# Patient Record
Sex: Male | Born: 1995 | Race: White | Hispanic: No | Marital: Single | Smoking: Never smoker
Health system: Southern US, Community
[De-identification: ages and names within clinical notes are randomized; demographics above are authoritative.]

## PROBLEM LIST (undated history)

## (undated) DIAGNOSIS — F1020 Alcohol dependence, uncomplicated: Secondary | ICD-10-CM

## (undated) DIAGNOSIS — J40 Bronchitis, not specified as acute or chronic: Secondary | ICD-10-CM

## (undated) DIAGNOSIS — M222X9 Patellofemoral disorders, unspecified knee: Secondary | ICD-10-CM

## (undated) DIAGNOSIS — K3580 Unspecified acute appendicitis: Secondary | ICD-10-CM

## (undated) DIAGNOSIS — J019 Acute sinusitis, unspecified: Secondary | ICD-10-CM

## (undated) DIAGNOSIS — F329 Major depressive disorder, single episode, unspecified: Secondary | ICD-10-CM

## (undated) HISTORY — DX: Major depressive disorder, single episode, unspecified: F32.9

## (undated) HISTORY — DX: Alcohol dependence, uncomplicated: F10.20

## (undated) HISTORY — DX: Bronchitis, not specified as acute or chronic: J40

## (undated) HISTORY — DX: Unspecified acute appendicitis: K35.80

## (undated) HISTORY — DX: Acute sinusitis, unspecified: J01.90

## (undated) HISTORY — DX: Patellofemoral disorders, unspecified knee: M22.2X9

## (undated) HISTORY — PX: ADENOIDECTOMY: SUR15

---
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
## 2015-06-04 DIAGNOSIS — J019 Acute sinusitis, unspecified: Secondary | ICD-10-CM | POA: Insufficient documentation

## 2015-06-04 HISTORY — DX: Acute sinusitis, unspecified: J01.90

## 2016-02-14 DIAGNOSIS — J358 Other chronic diseases of tonsils and adenoids: Secondary | ICD-10-CM | POA: Insufficient documentation

## 2016-12-16 DIAGNOSIS — K649 Unspecified hemorrhoids: Secondary | ICD-10-CM | POA: Insufficient documentation

## 2016-12-16 DIAGNOSIS — K625 Hemorrhage of anus and rectum: Secondary | ICD-10-CM | POA: Diagnosis present

## 2016-12-16 DIAGNOSIS — F17200 Nicotine dependence, unspecified, uncomplicated: Secondary | ICD-10-CM | POA: Diagnosis not present

## 2016-12-16 LAB — COMPREHENSIVE METABOLIC PANEL
ALBUMIN: 4.6 g/dL (ref 3.5–5.0)
ALT: 55 U/L (ref 17–63)
ANION GAP: 8 (ref 5–15)
AST: 127 U/L — ABNORMAL HIGH (ref 15–41)
Alkaline Phosphatase: 73 U/L (ref 38–126)
BILIRUBIN TOTAL: 0.8 mg/dL (ref 0.3–1.2)
BUN: 14 mg/dL (ref 6–20)
CO2: 28 mmol/L (ref 22–32)
Calcium: 9.2 mg/dL (ref 8.9–10.3)
Chloride: 100 mmol/L — ABNORMAL LOW (ref 101–111)
Creatinine, Ser: 1.2 mg/dL (ref 0.61–1.24)
Glucose, Bld: 108 mg/dL — ABNORMAL HIGH (ref 65–99)
POTASSIUM: 3.2 mmol/L — AB (ref 3.5–5.1)
Sodium: 136 mmol/L (ref 135–145)
TOTAL PROTEIN: 7.8 g/dL (ref 6.5–8.1)

## 2016-12-16 LAB — CBC
HCT: 41.2 % (ref 40.0–52.0)
HEMOGLOBIN: 14.3 g/dL (ref 13.0–18.0)
MCH: 29.8 pg (ref 26.0–34.0)
MCHC: 34.8 g/dL (ref 32.0–36.0)
MCV: 85.5 fL (ref 80.0–100.0)
Platelets: 276 10*3/uL (ref 150–440)
RBC: 4.82 MIL/uL (ref 4.40–5.90)
RDW: 12.2 % (ref 11.5–14.5)
WBC: 10.4 10*3/uL (ref 3.8–10.6)

## 2016-12-16 NOTE — ED Triage Notes (Signed)
Patient reports he has noticed blood in his stool "for while".  Reports heavier and bright red over the past few days.

## 2016-12-17 ENCOUNTER — Emergency Department
Admission: EM | Admit: 2016-12-17 | Discharge: 2016-12-17 | Disposition: A | Discharge status: Home / Self Care | Payer: BLUE CROSS/BLUE SHIELD | Attending: Emergency Medicine | Admitting: Emergency Medicine

## 2016-12-17 ENCOUNTER — Encounter: Payer: Self-pay | Admitting: Emergency Medicine

## 2016-12-17 DIAGNOSIS — K625 Hemorrhage of anus and rectum: Secondary | ICD-10-CM

## 2016-12-17 DIAGNOSIS — K649 Unspecified hemorrhoids: Secondary | ICD-10-CM

## 2016-12-17 MED ORDER — POTASSIUM CHLORIDE CRYS ER 20 MEQ PO TBCR
40.0000 meq | EXTENDED_RELEASE_TABLET | Freq: Once | ORAL | Status: AC
  Administered 2016-12-17: 40 meq via ORAL
  Filled 2016-12-17: qty 2

## 2016-12-17 NOTE — ED Notes (Signed)
Pt. Going home with friend 

## 2016-12-17 NOTE — Discharge Instructions (Signed)
Please follow-up with student health or with GI.

## 2016-12-17 NOTE — ED Provider Notes (Signed)
[facility] Emergency Department Provider Note   ____________________________________________   First MD Initiated Contact with Patient 12/17/16 [phone]     (approximate)  I have reviewed the triage vital signs and the nursing notes.   HISTORY  Chief Complaint Rectal Bleeding    HPI Carl Joseph is a 21 y.o. male who comes into the hospital today with some blood in his stool. The patient reports that yesterday and today he went to the bathroom and saw blood in his stool. He reports that he wasn't straining he just kind of sat and it came out. He reports that he called his mom and asked if he should come into the hospital and his mom said that he should. He reports that he was a little bit gassy in the stool came out tonight coated in blood. He reports that there was some blood in the water. 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 He reports that he has also had blood on the tissue many times in the past that he's never actually had checked out. The patient denies any abdominal pain and denies any nausea or vomiting. He does not have a known history of hemorrhoids but again has never had it checked. The patient reports that last weekend he was drinking a lot with his friend brothers and was concerned that this may have resulted from his weekend of drinking. He is here today for evaluation.The patient has not had any lightheadedness or dizziness, he denies any shortness of breath, chest pain or other concerns.   History reviewed. No pertinent past medical history.  There are no active problems to display for this patient.   Past Surgical History:  Procedure Laterality Date  . ADENOIDECTOMY      Prior to Admission medications   Not on File    Allergies Patient has no known allergies.  No family history on file.  Social History Social History  Substance Use Topics  . Smoking  status: Current Some Day Smoker  . Smokeless tobacco: Not on file  . Alcohol use Yes    Review of Systems  Constitutional: No fever/chills Eyes: No visual changes. ENT: No sore throat. Cardiovascular: Denies chest pain. Respiratory: Denies shortness of breath. Gastrointestinal: No abdominal pain.  No nausea, no vomiting.  No diarrhea.  No constipation. Rectal bleeding Genitourinary: Negative for dysuria. Musculoskeletal: Negative for back pain. Skin: Negative for rash. Neurological: Negative for headaches, focal weakness or numbness.   ____________________________________________   PHYSICAL EXAM:  VITAL SIGNS: ED Triage Vitals  Enc Vitals Group     BP 12/16/16 2237 (!) 154/95     Pulse Rate 12/16/16 2237 96     Resp 12/16/16 2237 (!) 22     Temp 12/16/16 2237 98 F (36.7 C)     Temp Source 12/16/16 2237 Oral     SpO2 12/16/16 2237 99 %     Weight 12/16/16 2236 190 lb (86.2 kg)     Height 12/16/16 2236  (1.803 m)     Head Circumference --      Peak Flow --      Pain Score --      Pain Loc --      Pain Edu? --      Excl. in GC? --     Constitutional: Alert and oriented. Well appearing and in no acute distress. Eyes: Conjunctivae are normal. PERRL. EOMI. Head:  Atraumatic. Nose: No congestion/rhinnorhea. Mouth/Throat: Mucous membranes are moist.  Oropharynx non-erythematous. Cardiovascular: Normal rate, regular rhythm. Grossly normal heart sounds.  Good peripheral circulation. Respiratory: Normal respiratory effort.  No retractions. Lungs CTAB. Gastrointestinal: Soft and nontender. No distention. Positive bowel sounds Rectal: No visible hemorrhoids on rectal exam. Patient has brown stool that is heme positive on Hemoccult no tenderness to palpation of rectum. Musculoskeletal: No lower extremity tenderness nor edema.  . Neurologic:  Normal speech and language.  Skin:  Skin is warm, dry and intact.  Psychiatric: Mood and affect are normal.    ____________________________________________   LABS (all labs ordered are listed, but only abnormal results are displayed)  Labs Reviewed  COMPREHENSIVE METABOLIC PANEL - Abnormal; Notable for the following:       Result Value   Potassium 3.2 (*)    Chloride 100 (*)    Glucose, Bld 108 (*)    AST 127 (*)    All other components within normal limits  CBC  POC OCCULT BLOOD, ED   ____________________________________________  EKG  none ____________________________________________  RADIOLOGY  none ____________________________________________   PROCEDURES  Procedure(s) performed: None  Procedures  Critical Care performed: No  ____________________________________________   INITIAL IMPRESSION / ASSESSMENT AND PLAN / ED COURSE  Pertinent labs & imaging results that were available during my care of the patient were reviewed by me and considered in my medical decision making (see chart for details).  This is a 22 year old male who comes into the hospital today with 2 episodes of bright red blood in the toilet bowl. He reports that it was with a bowel movement. The patient has had what sounds like some hemorrhoids in the past with some constipation. The patient's blood work is unremarkable. He does have an elevated AST but his hemoglobin and hematocrit are unremarkable. I will give the patient some potassium since it is 3.2. Since the patient has no other symptoms I feel that he may have some internal hemorrhoids causing some of this bleeding. I feel that the patient should follow-up with student health and GI. The patient reports that he will be back home in less than a month. As he has no pain I do not feel we need to do any further studies. I will discharge the patient home to have follow-up with GI and student health. The patient understands and agrees with this plan. He has no further questions or concerns.      ____________________________________________   FINAL  CLINICAL IMPRESSION(S) / ED DIAGNOSES  Final diagnoses:  Rectal bleeding  Hemorrhoids, unspecified hemorrhoid type      NEW MEDICATIONS STARTED DURING THIS VISIT:  New Prescriptions   No medications on file     Note:  This document was prepared using Dragon voice recognition software and may include unintentional dictation errors.    Rebecka Apley, MD 12/17/16 [phone]

## 2016-12-17 NOTE — ED Notes (Signed)
Pt. States rectal bleeding for some time.  Pt. States it was just showing up on toilet paper.  Pt. States in the past couple days pt. Noticed blood on stool.  Pt. States blood was bright blood.  Pt. Alert and oriented, with friend in room.

## 2017-06-16 ENCOUNTER — Observation Stay
Admission: EM | Admit: 2017-06-16 | Discharge: 2017-06-17 | Disposition: A | Discharge status: Home / Self Care | Payer: BLUE CROSS/BLUE SHIELD | Attending: Surgery | Admitting: Surgery

## 2017-06-16 ENCOUNTER — Encounter: Payer: Self-pay | Admitting: *Deleted

## 2017-06-16 ENCOUNTER — Emergency Department: Payer: BLUE CROSS/BLUE SHIELD

## 2017-06-16 DIAGNOSIS — K381 Appendicular concretions: Secondary | ICD-10-CM | POA: Insufficient documentation

## 2017-06-16 DIAGNOSIS — K3580 Unspecified acute appendicitis: Secondary | ICD-10-CM | POA: Diagnosis not present

## 2017-06-16 DIAGNOSIS — K353 Acute appendicitis with localized peritonitis, without perforation or gangrene: Secondary | ICD-10-CM | POA: Diagnosis not present

## 2017-06-16 DIAGNOSIS — I10 Essential (primary) hypertension: Secondary | ICD-10-CM | POA: Insufficient documentation

## 2017-06-16 DIAGNOSIS — K3589 Other acute appendicitis without perforation or gangrene: Secondary | ICD-10-CM | POA: Diagnosis not present

## 2017-06-16 DIAGNOSIS — Z87891 Personal history of nicotine dependence: Secondary | ICD-10-CM | POA: Insufficient documentation

## 2017-06-16 LAB — COMPREHENSIVE METABOLIC PANEL
ALBUMIN: 4.7 g/dL (ref 3.5–5.0)
ALK PHOS: 64 U/L (ref 38–126)
ALT: 37 U/L (ref 17–63)
ANION GAP: 10 (ref 5–15)
AST: 34 U/L (ref 15–41)
BILIRUBIN TOTAL: 1.3 mg/dL — AB (ref 0.3–1.2)
BUN: 18 mg/dL (ref 6–20)
CALCIUM: 10.1 mg/dL (ref 8.9–10.3)
CO2: 26 mmol/L (ref 22–32)
Chloride: 100 mmol/L — ABNORMAL LOW (ref 101–111)
Creatinine, Ser: 0.98 mg/dL (ref 0.61–1.24)
GFR calc Af Amer: >60 mL/min (ref >60)
GLUCOSE: 128 mg/dL — AB (ref 65–99)
Potassium: 3.3 mmol/L — ABNORMAL LOW (ref 3.5–5.1)
Sodium: 136 mmol/L (ref 135–145)
TOTAL PROTEIN: 8.4 g/dL — AB (ref 6.5–8.1)

## 2017-06-16 LAB — CBC
HCT: 45 % (ref 40.0–52.0)
HEMOGLOBIN: 15.3 g/dL (ref 13.0–18.0)
MCH: 29.2 pg (ref 26.0–34.0)
MCHC: 33.9 g/dL (ref 32.0–36.0)
MCV: 86.1 fL (ref 80.0–100.0)
Platelets: 269 10*3/uL (ref 150–440)
RBC: 5.23 MIL/uL (ref 4.40–5.90)
RDW: 12.9 % (ref 11.5–14.5)
WBC: 19 10*3/uL — AB (ref 3.8–10.6)

## 2017-06-16 LAB — LIPASE, BLOOD: Lipase: 20 U/L (ref 11–51)

## 2017-06-16 MED ORDER — ONDANSETRON HCL 4 MG/2ML IJ SOLN
INTRAMUSCULAR | Status: AC
  Administered 2017-06-16: 4 mg via INTRAVENOUS
  Filled 2017-06-16: qty 2

## 2017-06-16 MED ORDER — SODIUM CHLORIDE 0.9 % IV BOLUS (SEPSIS)
1000.0000 mL | Freq: Once | INTRAVENOUS | Status: AC
  Administered 2017-06-16: 1000 mL via INTRAVENOUS

## 2017-06-16 MED ORDER — IOPAMIDOL (ISOVUE-300) INJECTION 61%
30.0000 mL | Freq: Once | INTRAVENOUS | Status: AC | PRN
  Administered 2017-06-16: 30 mL via ORAL

## 2017-06-16 MED ORDER — PIPERACILLIN-TAZOBACTAM 3.375 G IVPB 30 MIN
3.3750 g | Freq: Once | INTRAVENOUS | Status: AC
  Administered 2017-06-17: 3.375 g via INTRAVENOUS
  Filled 2017-06-16: qty 50

## 2017-06-16 MED ORDER — MORPHINE SULFATE (PF) 4 MG/ML IV SOLN: 4.0000 mg | Freq: Once | INTRAVENOUS | Status: DC

## 2017-06-16 MED ORDER — LACTATED RINGERS IV SOLN
INTRAVENOUS | Status: DC
  Administered 2017-06-17: 02:00:00 via INTRAVENOUS

## 2017-06-16 MED ORDER — IOPAMIDOL (ISOVUE-300) INJECTION 61%
100.0000 mL | Freq: Once | INTRAVENOUS | Status: AC | PRN
  Administered 2017-06-16: 100 mL via INTRAVENOUS

## 2017-06-16 MED ORDER — MORPHINE SULFATE (PF) 4 MG/ML IV SOLN
INTRAVENOUS | Status: AC
  Filled 2017-06-16: qty 1

## 2017-06-16 MED ORDER — ONDANSETRON HCL 4 MG/2ML IJ SOLN
4.0000 mg | Freq: Once | INTRAMUSCULAR | Status: AC
  Administered 2017-06-16: 4 mg via INTRAVENOUS

## 2017-06-16 NOTE — ED Notes (Signed)
CT notified patient has completed oral contrast

## 2017-06-16 NOTE — ED Provider Notes (Signed)
[facility] Emergency Department Provider Note   ____________________________________________   I have reviewed the triage vital signs and the nursing notes.   HISTORY  Chief Complaint Abdominal Pain   History limited by: Not Limited   HPI Carl Joseph is a 21 y.o. male who presents to the emergency department today because of abdominal pain.   LOCATION:right abdomen and periumbilical DURATION:4 hours TIMING: sudden onset SEVERITY: severe QUALITY: sharp CONTEXT: patient denies any trauma.  ASSOCIATED SYMPTOMS: no nausea or vomiting. No recent change in bowel movement  Per medical record review no pertinent past medical history  History reviewed. No pertinent past medical history.  There are no active problems to display for this patient.   Past Surgical History:  Procedure Laterality Date  . ADENOIDECTOMY      Prior to Admission medications   Not on File    Allergies Patient has no known allergies.  No family history on file.  Social History Social History  Substance Use Topics  . Smoking status: Never Smoker  . Smokeless tobacco: Never Used  . Alcohol use Yes    Review of Systems Constitutional: No fever/chills Eyes: No visual changes. ENT: No sore throat. Cardiovascular: Denies chest pain. Respiratory: Denies shortness of breath. Gastrointestinal: Positive for abdominal pain. Genitourinary: Negative for dysuria. Musculoskeletal: Negative for back pain. Skin: Negative for rash. Neurological: Negative for headaches, focal weakness or numbness.  ____________________________________________   PHYSICAL EXAM:  VITAL SIGNS: ED Triage Vitals  Enc Vitals Group     BP 06/16/17 2222 (!) 114/57     Pulse Rate 06/16/17 2222 75     Resp 06/16/17 2222 18     Temp 06/16/17 2222 98 F (36.7 C)     Temp Source 06/16/17 2222 Oral     SpO2 06/16/17 2222 99 %     Weight 06/16/17 2218 180 lb (81.6 kg)     Height  06/16/17 2218 5\' 11"  (1.803 m)     Head Circumference --      Peak Flow --      Pain Score 06/16/17 2218 8   Constitutional: Alert and oriented. Well appearing and in no distress. Eyes: Conjunctivae are normal.  ENT   Head: Normocephalic and atraumatic.   Nose: No congestion/rhinnorhea.   Mouth/Throat: Mucous membranes are moist.   Neck: No stridor. Hematological/Lymphatic/Immunilogical: No cervical lymphadenopathy. Cardiovascular: Normal rate, regular rhythm.  No murmurs, rubs, or gallops. Respiratory: Normal respiratory effort without tachypnea nor retractions. Breath sounds are clear and equal bilaterally. No wheezes/rales/rhonchi. Gastrointestinal: Soft and tender to palpation in the right lower quadrant. No rebound. No guarding. Positive rovsings sign. Genitourinary: Deferred Musculoskeletal: Normal range of motion in all extremities. No lower extremity edema. Neurologic:  Normal speech and language. No gross focal neurologic deficits are appreciated.  Skin:  Skin is warm, dry and intact. No rash noted. Psychiatric: Mood and affect are normal. Speech and behavior are normal. Patient exhibits appropriate insight and judgment.  ____________________________________________    LABS (pertinent positives/negatives)  CBC wbc 19.0 CMP k 3.3 glu 128 Lipase 20 ____________________________________________   EKG  None  ____________________________________________    RADIOLOGY  CT abd/pel Appendicitis   ____________________________________________   PROCEDURES  Procedures  ____________________________________________   INITIAL IMPRESSION / ASSESSMENT AND PLAN / ED COURSE  Pertinent labs & imaging results that were available during my care of the patient were reviewed by me and considered in my medical decision making (see chart for details).  Differential diagnosis includes, but is  not limited to, acute appendicitis, renal colic, testicular torsion,  urinary tract infection/pyelonephritis, prostatitis,  epididymitis, diverticulitis, small bowel obstruction or ileus, colitis, abdominal aortic aneurysm, gastroenteritis, hernia, etc. Discussed concern for appendicitis with patient. Workup is consistent with appendicitis.  Patient will be admitted to surgical service.  I did discuss the case with Dr. Excell Seltzerooper.  Discussed with patient/family results of testing/physical exam, differential plan and return precautions.  ____________________________________________   FINAL CLINICAL IMPRESSION(S) / ED DIAGNOSES  Final diagnoses:  Other acute appendicitis     Note: This dictation was prepared with Dragon dictation. Any transcriptional errors that result from this process are unintentional     Phineas SemenGoodman, Brier Firebaugh, MD 06/17/17 [phone]

## 2017-06-16 NOTE — H&P (Signed)
Carl Joseph is an 21 y.o. male.    Chief Complaint: Right lower quadrant pain  HPI: This patient with a history of right lower quadrant pain a workup was suggested acute appendicitis.  I was asked to see the patient by Dr. Archie Balboa in the emergency room.  Patient is a vague historian.  He states that this happened a few hours ago.  When questioned further he states that he was perfectly okay earlier today but then also states that 2 weeks ago he started having right lower quadrant pain so it is unclear to me as to actually when this started.  He denies nausea vomiting fevers or chills and has never had an episode like this before.  He points to the right lower quadrant.  He has no family history of Crohn's disease.  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  He stopped smoking a year ago but continues to vape and drinks up to 5 beers per day. He has no past medical history with the exception of his Adderall use He has had a tonsillectomy adenoidectomy. History reviewed. No pertinent past medical history.  Past Surgical History:  Procedure Laterality Date  . ADENOIDECTOMY      No family history on file. Social History:  reports that he has never smoked. He has never used smokeless tobacco. He reports that he drinks alcohol. He reports that he does not use drugs.  Allergies: No Known Allergies   (Not in a hospital admission)   Review of Systems  Constitutional: Negative.   HENT: Negative.   Eyes: Negative.   Respiratory: Negative.   Cardiovascular: Negative.   Gastrointestinal: Positive for abdominal pain. Negative for blood in stool, constipation, diarrhea, heartburn, nausea and vomiting.  Genitourinary: Negative.   Musculoskeletal: Negative.   Skin: Negative.   Neurological: Negative.   Endo/Heme/Allergies: Negative.   Psychiatric/Behavioral: Negative.      Physical Exam:  BP 134/82   Pulse 74   Temp 98 F (36.7 C) (Oral)    Resp 18   Ht _0  (1.803 m)   Wt 180 lb (81.6 kg)   SpO2 100%   BMI 25.10 kg/m   Physical Exam  Constitutional: He is oriented to person, place, and time and well-developed, well-nourished, and in no distress. No distress.  HENT:  Head: Normocephalic and atraumatic.  Eyes: Pupils are equal, round, and reactive to light. Right eye exhibits no discharge. Left eye exhibits no discharge. No scleral icterus.  Neck: Normal range of motion.  Cardiovascular: Normal rate, regular rhythm and normal heart sounds.   Pulmonary/Chest: Effort normal and breath sounds normal. No respiratory distress. He has no wheezes. He has no rales.  Abdominal: Soft. He exhibits no distension. There is tenderness. There is guarding. There is no rebound.  Tenderness in the right lower quadrant at McBurney's point.  There is a vague suggestion of Rovsing sign but not obviously positive.  There is some guarding but no rebound or percussion tenderness.  Musculoskeletal: Normal range of motion. He exhibits no edema.  Lymphadenopathy:    He has no cervical adenopathy.  Neurological: He is alert and oriented to person, place, and time.  Skin: Skin is warm and dry. No rash noted. He is not diaphoretic. No erythema.  Psychiatric: Mood and affect normal.  Vitals reviewed.       Results for orders placed or performed during the hospital encounter of 06/16/17 (from the past 48 hour(s))  Lipase, blood  Status: None   Collection Time: 06/16/17 10:17 PM  Result Value Ref Range   Lipase 20 11 - 51 U/L  Comprehensive metabolic panel     Status: Abnormal   Collection Time: 06/16/17 10:17 PM  Result Value Ref Range   Sodium 136 135 - 145 mmol/L   Potassium 3.3 (L) 3.5 - 5.1 mmol/L   Chloride 100 (L) 101 - 111 mmol/L   CO2 26 22 - 32 mmol/L   Glucose, Bld 128 (H) 65 - 99 mg/dL   BUN 18 6 - 20 mg/dL   Creatinine, Ser 0.98 0.61 - 1.24 mg/dL   Calcium 10.1 8.9 - 10.3 mg/dL   Total Protein 8.4 (H) 6.5 - 8.1 g/dL    Albumin 4.7 3.5 - 5.0 g/dL   AST 34 15 - 41 U/L   ALT 37 17 - 63 U/L   Alkaline Phosphatase 64 38 - 126 U/L   Total Bilirubin 1.3 (H) 0.3 - 1.2 mg/dL   GFR calc non Af Amer >60 >60 mL/min   GFR calc Af Amer >60 >60 mL/min    Comment: (NOTE) The eGFR has been calculated using the CKD EPI equation. This calculation has not been validated in all clinical situations. eGFR's persistently <60 mL/min signify possible Chronic Kidney Disease.    Anion gap 10 5 - 15  CBC     Status: Abnormal   Collection Time: 06/16/17 10:17 PM  Result Value Ref Range   WBC 19.0 (H) 3.8 - 10.6 K/uL   RBC 5.23 4.40 - 5.90 MIL/uL   Hemoglobin 15.3 13.0 - 18.0 g/dL   HCT 45.0 40.0 - 52.0 %   MCV 86.1 80.0 - 100.0 fL   MCH 29.2 26.0 - 34.0 pg   MCHC 33.9 32.0 - 36.0 g/dL   RDW 12.9 11.5 - 14.5 %   Platelets 269 150 - 440 K/uL   Ct Abdomen Pelvis W Contrast  Result Date: 06/16/2017 CLINICAL DATA:  Right-sided abdominal pain for 4 hours. EXAM: CT ABDOMEN AND PELVIS WITH CONTRAST TECHNIQUE: Multidetector CT imaging of the abdomen and pelvis was performed using the standard protocol following bolus administration of intravenous contrast. CONTRAST:  199m ISOVUE-300 IOPAMIDOL (ISOVUE-300) INJECTION 61% COMPARISON:  None. FINDINGS: Lower chest: The lung bases are clear. Hepatobiliary: No focal liver abnormality is seen. No gallstones, gallbladder wall thickening, or biliary dilatation. Pancreas: No ductal dilatation or inflammation. Spleen: Normal in size without focal abnormality. Adrenals/Urinary Tract: Adrenal glands are unremarkable. Kidneys are normal, without renal calculi, focal lesion, or hydronephrosis. Bladder is unremarkable. Stomach/Bowel: The appendix is dilated measuring 11 mm, with thick enhancing wall and surrounding periappendiceal inflammation. There is a 9 mm appendicolith at the base. Trace periappendiceal free fluid and free fluid in the pelvis, no abscess or free air. The stomach, small and large  bowel are otherwise normal. Vascular/Lymphatic: Small ileal colic and retroperitoneal nodes, presumably reactive. Vascular structures are unremarkable. Reproductive: Prostate is unremarkable. Other: Small amount of free fluid in the pelvis. No abscess or free air. No upper abdominal ascites. Musculoskeletal: There are no acute or suspicious osseous abnormalities. IMPRESSION: Acute appendicitis with an appendicolith at the base. No free air, abscess or evidence of perforation. Electronically Signed   By: MJeb LeveringM.D.   On: 06/16/2017 23:48     Assessment/Plan  CT scan is personally reviewed.  Labs are reviewed showing elevated white blood cell count.  This patient with history physical and ancillary studies all suggestive of acute appendicitis.  I recommended laparoscopic  appendectomy.  I discussed with him the rationale for offering surgery versus IV antibiotic therapy.  We discussed IV antibiotics and the potential for recurrence and a ruptured appendix in the future or the need for further surgery in the future.  He has asked multiple good questions and opted for surgical intervention this evening.  We discussed the risks of bleeding infection conversion to an open procedure and recurrence including recurrent infection and abscess he understood and agreed with this plan.  He has no family present but is going to call his family in [place].  We will proceed with surgery this evening.  Florene Glen, MD, FACS

## 2017-06-16 NOTE — ED Triage Notes (Signed)
Pt has right lower abd pain for 4 hours.  Pt has nausea.  Denies urinary sx.   Pt pale Pt alert.

## 2017-06-17 ENCOUNTER — Emergency Department: Payer: BLUE CROSS/BLUE SHIELD | Admitting: Certified Registered"

## 2017-06-17 ENCOUNTER — Encounter
Admission: EM | Disposition: A | Discharge status: Home / Self Care | Payer: Self-pay | Source: Home / Self Care | Attending: Emergency Medicine

## 2017-06-17 ENCOUNTER — Encounter: Payer: Self-pay | Admitting: Anesthesiology

## 2017-06-17 DIAGNOSIS — K3580 Unspecified acute appendicitis: Secondary | ICD-10-CM

## 2017-06-17 HISTORY — PX: LAPAROSCOPIC APPENDECTOMY: SHX408

## 2017-06-17 HISTORY — DX: Unspecified acute appendicitis: K35.80

## 2017-06-17 SURGERY — APPENDECTOMY, LAPAROSCOPIC
Anesthesia: General

## 2017-06-17 MED ORDER — ROCURONIUM BROMIDE 100 MG/10ML IV SOLN
INTRAVENOUS | Status: DC | PRN
  Administered 2017-06-17: 30 mg via INTRAVENOUS

## 2017-06-17 MED ORDER — PROPOFOL 10 MG/ML IV BOLUS
INTRAVENOUS | Status: AC
  Filled 2017-06-17: qty 20

## 2017-06-17 MED ORDER — PROMETHAZINE HCL 25 MG/ML IJ SOLN: 6.2500 mg | INTRAMUSCULAR | Status: DC | PRN

## 2017-06-17 MED ORDER — SUGAMMADEX SODIUM 200 MG/2ML IV SOLN
INTRAVENOUS | Status: AC
  Filled 2017-06-17: qty 2

## 2017-06-17 MED ORDER — LIDOCAINE HCL (PF) 2 % IJ SOLN
INTRAMUSCULAR | Status: AC
  Filled 2017-06-17: qty 10

## 2017-06-17 MED ORDER — ROCURONIUM BROMIDE 50 MG/5ML IV SOLN
INTRAVENOUS | Status: AC
  Filled 2017-06-17: qty 1

## 2017-06-17 MED ORDER — FENTANYL CITRATE (PF) 100 MCG/2ML IJ SOLN
INTRAMUSCULAR | Status: AC
  Filled 2017-06-17: qty 2

## 2017-06-17 MED ORDER — LIDOCAINE HCL (CARDIAC) 20 MG/ML IV SOLN
INTRAVENOUS | Status: DC | PRN
  Administered 2017-06-17: 100 mg via INTRAVENOUS

## 2017-06-17 MED ORDER — ONDANSETRON HCL 4 MG/2ML IJ SOLN: 4.0000 mg | Freq: Four times a day (QID) | INTRAMUSCULAR | Status: DC | PRN

## 2017-06-17 MED ORDER — ONDANSETRON HCL 4 MG/2ML IJ SOLN
INTRAMUSCULAR | Status: AC
  Filled 2017-06-17: qty 2

## 2017-06-17 MED ORDER — DEXMEDETOMIDINE HCL 200 MCG/2ML IV SOLN
INTRAVENOUS | Status: DC | PRN
  Administered 2017-06-17: 20 ug via INTRAVENOUS

## 2017-06-17 MED ORDER — SODIUM CHLORIDE 0.9 % IV SOLN
INTRAVENOUS | Status: DC | PRN
  Administered 2017-06-16: 23:00:00 via INTRAVENOUS

## 2017-06-17 MED ORDER — DEXAMETHASONE SODIUM PHOSPHATE 10 MG/ML IJ SOLN
INTRAMUSCULAR | Status: AC
  Filled 2017-06-17: qty 1

## 2017-06-17 MED ORDER — HYDROCODONE-ACETAMINOPHEN 5-325 MG PO TABS
1.0000 | ORAL_TABLET | ORAL | Status: DC | PRN
Start: 2017-06-17 — End: 2017-06-17
  Administered 2017-06-17: 1 via ORAL
  Filled 2017-06-17: qty 1

## 2017-06-17 MED ORDER — SUGAMMADEX SODIUM 200 MG/2ML IV SOLN
INTRAVENOUS | Status: DC | PRN
  Administered 2017-06-17: 160 mg via INTRAVENOUS

## 2017-06-17 MED ORDER — MIDAZOLAM HCL 2 MG/2ML IJ SOLN
INTRAMUSCULAR | Status: AC
  Filled 2017-06-17: qty 2

## 2017-06-17 MED ORDER — ONDANSETRON HCL 4 MG PO TABS: 4.0000 mg | ORAL_TABLET | Freq: Four times a day (QID) | ORAL | Status: DC | PRN

## 2017-06-17 MED ORDER — ONDANSETRON HCL 4 MG/2ML IJ SOLN
INTRAMUSCULAR | Status: DC | PRN
  Administered 2017-06-17: 4 mg via INTRAVENOUS

## 2017-06-17 MED ORDER — SUCCINYLCHOLINE CHLORIDE 20 MG/ML IJ SOLN
INTRAMUSCULAR | Status: DC | PRN
  Administered 2017-06-17: 100 mg via INTRAVENOUS

## 2017-06-17 MED ORDER — BUPIVACAINE-EPINEPHRINE (PF) 0.25% -1:200000 IJ SOLN
INTRAMUSCULAR | Status: DC | PRN
  Administered 2017-06-17: 30 mL via PERINEURAL

## 2017-06-17 MED ORDER — ACETAMINOPHEN 10 MG/ML IV SOLN
INTRAVENOUS | Status: DC | PRN
  Administered 2017-06-17: 1000 mg via INTRAVENOUS

## 2017-06-17 MED ORDER — MORPHINE SULFATE (PF) 4 MG/ML IV SOLN: 2.0000 mg | INTRAVENOUS | Status: DC | PRN

## 2017-06-17 MED ORDER — DEXAMETHASONE SODIUM PHOSPHATE 10 MG/ML IJ SOLN
INTRAMUSCULAR | Status: DC | PRN
  Administered 2017-06-17: 4 mg via INTRAVENOUS

## 2017-06-17 MED ORDER — FENTANYL CITRATE (PF) 100 MCG/2ML IJ SOLN
INTRAMUSCULAR | Status: DC | PRN
  Administered 2017-06-17: 100 ug via INTRAVENOUS
  Administered 2017-06-17: 50 ug via INTRAVENOUS

## 2017-06-17 MED ORDER — HYDROCODONE-ACETAMINOPHEN 5-325 MG PO TABS
1.0000 | ORAL_TABLET | ORAL | 0 refills | Status: AC | PRN
Start: 2017-06-17 — End: ?

## 2017-06-17 MED ORDER — FENTANYL CITRATE (PF) 100 MCG/2ML IJ SOLN: 25.0000 ug | INTRAMUSCULAR | Status: DC | PRN

## 2017-06-17 MED ORDER — ACETAMINOPHEN 10 MG/ML IV SOLN
INTRAVENOUS | Status: AC
  Filled 2017-06-17: qty 100

## 2017-06-17 MED ORDER — PROPOFOL 10 MG/ML IV BOLUS
INTRAVENOUS | Status: DC | PRN
  Administered 2017-06-17: 200 mg via INTRAVENOUS

## 2017-06-17 MED ORDER — BUPIVACAINE-EPINEPHRINE (PF) 0.25% -1:200000 IJ SOLN
INTRAMUSCULAR | Status: AC
  Filled 2017-06-17: qty 30

## 2017-06-17 SURGICAL SUPPLY — 40 items
ADHESIVE MASTISOL STRL (MISCELLANEOUS) ×2 IMPLANT
APPLIER CLIP ROT 10 11.4 M/L (STAPLE) ×2
BLADE SURG SZ11 CARB STEEL (BLADE) ×2 IMPLANT
CANISTER SUCT 3000ML PPV (MISCELLANEOUS) ×2 IMPLANT
CHLORAPREP W/TINT 26ML (MISCELLANEOUS) ×2 IMPLANT
CLIP APPLIE ROT 10 11.4 M/L (STAPLE) ×1 IMPLANT
CUTTER FLEX LINEAR 45M (STAPLE) ×2 IMPLANT
DEVICE TROCAR PUNCTURE CLOSURE (ENDOMECHANICALS) ×2 IMPLANT
ELECT REM PT RETURN 9FT ADLT (ELECTROSURGICAL) ×2
ELECTRODE REM PT RTRN 9FT ADLT (ELECTROSURGICAL) ×1 IMPLANT
GAUZE SPONGE NON-WVN 2X2 STRL (MISCELLANEOUS) ×3 IMPLANT
GLOVE BIO SURGEON STRL SZ8 (GLOVE) ×4 IMPLANT
GOWN STRL REUS W/ TWL LRG LVL3 (GOWN DISPOSABLE) ×2 IMPLANT
GOWN STRL REUS W/TWL LRG LVL3 (GOWN DISPOSABLE) ×2
IRRIGATION STRYKERFLOW (MISCELLANEOUS) ×1 IMPLANT
IRRIGATOR STRYKERFLOW (MISCELLANEOUS) ×2
KIT RM TURNOVER STRD PROC AR (KITS) ×2 IMPLANT
LABEL OR SOLS (LABEL) ×2 IMPLANT
NDL SAFETY 22GX1.5 (NEEDLE) ×2 IMPLANT
NEEDLE VERESS 14GA 120MM (NEEDLE) ×2 IMPLANT
NS IRRIG 500ML POUR BTL (IV SOLUTION) ×2 IMPLANT
PACK LAP CHOLECYSTECTOMY (MISCELLANEOUS) ×2 IMPLANT
POUCH SPECIMEN RETRIEVAL 10MM (ENDOMECHANICALS) ×2 IMPLANT
RELOAD 45 VASCULAR/THIN (ENDOMECHANICALS) ×4 IMPLANT
RELOAD STAPLE TA45 3.5 REG BLU (ENDOMECHANICALS) ×2 IMPLANT
SCISSORS METZENBAUM CVD 33 (INSTRUMENTS) ×2 IMPLANT
SLEEVE ENDOPATH XCEL 5M (ENDOMECHANICALS) ×2 IMPLANT
SOL .9 NS 3000ML IRR  AL (IV SOLUTION) ×1
SOL .9 NS 3000ML IRR UROMATIC (IV SOLUTION) ×1 IMPLANT
SPONGE LAP 18X18 5 PK (GAUZE/BANDAGES/DRESSINGS) ×2 IMPLANT
SPONGE VERSALON 2X2 STRL (MISCELLANEOUS) ×3
STRIP CLOSURE SKIN 1/2X4 (GAUZE/BANDAGES/DRESSINGS) ×2 IMPLANT
SUT MNCRL 4-0 (SUTURE) ×1
SUT MNCRL 4-0 27XMFL (SUTURE) ×1
SUT VICRYL 0 TIES 12 18 (SUTURE) ×2 IMPLANT
SUTURE MNCRL 4-0 27XMF (SUTURE) ×1 IMPLANT
TRAY FOLEY W/METER SILVER 16FR (SET/KITS/TRAYS/PACK) ×2 IMPLANT
TROCAR XCEL 12X100 BLDLESS (ENDOMECHANICALS) ×2 IMPLANT
TROCAR XCEL NON-BLD 5MMX100MML (ENDOMECHANICALS) ×2 IMPLANT
TUBING INSUFFLATOR HI FLOW (MISCELLANEOUS) ×2 IMPLANT

## 2017-06-17 NOTE — Progress Notes (Signed)
06/17/2017 4:15 PM  Carl Joseph to be D/C'd Home per MD order.  Discussed prescriptions and follow up appointments with the patient. Prescriptions given to patient, medication list explained in detail. Pt verbalized understanding.  Allergies as of 06/17/2017   No Known Allergies     Medication List    TAKE these medications   ADDERALL XR 20 MG 24 hr capsule Generic drug:  amphetamine-dextroamphetamine Take 20 mg by mouth every morning.   amphetamine-dextroamphetamine 20 MG tablet Commonly known as:  ADDERALL Take 1 tablet by mouth every evening.   HYDROcodone-acetaminophen 5-325 MG tablet Commonly known as:  NORCO/VICODIN Take 1-2 tablets by mouth every 4 (four) hours as needed for moderate pain.       Vitals:   06/17/17 0401 06/17/17 1413  BP: 123/83 138/81  Pulse: 68 69  Resp: 19 18  Temp: 98.3 F (36.8 C) 98.2 F (36.8 C)  SpO2: 99% 98%    Skin clean, dry and intact without evidence of skin break down, no evidence of skin tears noted. IV catheter discontinued intact. Site without signs and symptoms of complications. Dressing and pressure applied. Pt denies pain at this time. No complaints noted.  An After Visit Summary was printed and given to the patient. Patient escorted via WC, and D/C home via private auto.  Bradly Chrisougherty, Teagan Heidrick E

## 2017-06-17 NOTE — Transfer of Care (Signed)
Immediate Anesthesia Transfer of Care Note  Patient: Carl Joseph  Procedure(s) Performed: APPENDECTOMY LAPAROSCOPIC (N/A )  Patient Location: PACU  Anesthesia Type:General  Level of Consciousness: sedated and responds to stimulation  Airway & Oxygen Therapy: Patient Spontanous Breathing and Patient connected to face mask oxygen  Post-op Assessment: Report given to RN and Post -op Vital signs reviewed and stable  Post vital signs: Reviewed and stable  Last Vitals:  Vitals:   06/17/17 0136 06/17/17 0138  BP: 111/68 111/68  Pulse: 80 79  Resp: 19 18  Temp: (!) 36.4 C   SpO2: 98% 98%    Last Pain:  Vitals:   06/16/17 2242  TempSrc:   PainSc: 6          Complications: No apparent anesthesia complications

## 2017-06-17 NOTE — Discharge Summary (Signed)
Patient ID: Minerva Festerlexander D Uliano MRN: [id number] DOB/AGE: 08/22/1995 21 y.o.  Admit date: 06/16/2017 Discharge date: 06/17/2017  Discharge Diagnoses:  Appendicitis  Procedures Performed: Laparoscopic appendectomy  Discharged Condition: good  Hospital Course: patient taken to OR from ER for appendectomy. Tolerated a laparoscopic appendectomy without issues. Had an uneventfull post operative course and was discharged home the first post operative day.  Discharge Orders: Discharge Instructions    Call MD for:  difficulty breathing, headache or visual disturbances    Complete by:  As directed    Call MD for:  persistant nausea and vomiting    Complete by:  As directed    Call MD for:  redness, tenderness, or signs of infection (pain, swelling, redness, odor or green/yellow discharge around incision site)    Complete by:  As directed    Call MD for:  severe uncontrolled pain    Complete by:  As directed    Call MD for:  temperature >100.4    Complete by:  As directed    Diet - low sodium heart healthy    Complete by:  As directed    Increase activity slowly    Complete by:  As directed       Disposition: 01-Home or Self Care  Discharge Medications: Allergies as of 06/17/2017   No Known Allergies     Medication List    TAKE these medications   ADDERALL XR 20 MG 24 hr capsule Generic drug:  amphetamine-dextroamphetamine Take 20 mg by mouth every morning.   amphetamine-dextroamphetamine 20 MG tablet Commonly known as:  ADDERALL Take 1 tablet by mouth every evening.   HYDROcodone-acetaminophen 5-325 MG tablet Commonly known as:  NORCO/VICODIN Take 1-2 tablets by mouth every 4 (four) hours as needed for moderate pain.        Follwup: Follow-up Information    [facility] [place]. Schedule an appointment as soon as possible for a visit in 10 day(s).   Specialty:  General Surgery Why:  f/u appendicitis Contact information: [address] [place] [id number] [phone]          Signed: Ricarda FrameCharles Rosali Augello 06/17/2017, 3:50 PM

## 2017-06-17 NOTE — ED Notes (Signed)
Patient transported to OR by Carl Joseph, EDT

## 2017-06-17 NOTE — Discharge Instructions (Signed)
Laparoscopic Appendectomy, Adult, Care After Refer to this sheet in the next few weeks. These instructions provide you with information about caring for yourself after your procedure. Your health care provider may also give you more specific instructions. Your treatment has been planned according to current medical practices, but problems sometimes occur. Call your health care provider if you have any problems or questions after your procedure. What can I expect after the procedure? After the procedure, it is common to have:  A decrease in your energy level.  Mild pain in the area where the surgical cuts (incisions) were made.  Constipation. This can be caused by pain medicine and a decrease in your activity.  Follow these instructions at home: Medicines  Take over-the-counter and prescription medicines only as told by your health care provider.  Do not drive for 24 hours if you received a sedative.  Do not drive or operate heavy machinery while taking prescription pain medicine.  If you were prescribed an antibiotic medicine, take it as told by your health care provider. Do not stop taking the antibiotic even if you start to feel better. Activity  For 3 weeks or as long as told by your health care provider: ? Do not lift anything that is heavier than 10 pounds (4.5 kg). ? Do not play contact sports.  Gradually return to your normal activities. Ask your health care provider what activities are safe for you. Bathing  Keep your incisions clean and dry. Clean them as often as told by your health care provider: ? Gently wash the incisions with soap and water. ? Rinse the incisions with water to remove all soap. ? Pat the incisions dry with a clean towel. Do not rub the incisions.  You may take showers after 24 hours.  Do not take baths, swim, or use hot tubs for 2 weeks or as told by your health care provider. Incision care  Follow instructions from your healthcare provider about  how to take care of your incisions. Make sure you: ? Wash your hands with soap and water before you change your bandage (dressing). If soap and water are not available, use hand sanitizer. ? Change your dressing as told by your health care provider. ? Leave stitches (sutures), skin glue, or adhesive strips in place. These skin closures may need to stay in place for 2 weeks or longer. If adhesive strip edges start to loosen and curl up, you may trim the loose edges. Do not remove adhesive strips completely unless your health care provider tells you to do that.  Check your incision areas every day for signs of infection. Check for: ? More redness, swelling, or pain. ? More fluid or blood. ? Warmth. ? Pus or a bad smell. Other Instructions  If you were sent home with a drain, follow instructions from your health care provider about how to care for the drain and how to empty it.  Take deep breaths. This helps to prevent your lungs from becoming inflamed.  To relieve and prevent constipation: ? Drink plenty of fluids. ? Eat plenty of fruits and vegetables.  Keep all follow-up visits as told by your health care provider. This is important. Contact a health care provider if:  You have more redness, swelling, or pain around an incision.  You have more fluid or blood coming from an incision.  Your incision feels warm to the touch.  You have pus or a bad smell coming from an incision or dressing.  Your incision  edges break open after your sutures have been removed.  You have increasing pain in your shoulders.  You feel dizzy or you faint.  You develop shortness of breath.  You keep feeling nauseous or vomiting.  You have diarrhea or you cannot control your bowel functions.  You lose your appetite.  You develop swelling or pain in your legs. Get help right away if:  You have a fever.  You develop a rash.  You have difficulty breathing.  You have sharp pains in your  chest. This information is not intended to replace advice given to you by your health care provider. Make sure you discuss any questions you have with your health care provider. Document Released: 08/06/2005 Document Revised: 01/06/2016 Document Reviewed: 01/24/2015 Elsevier Interactive Patient Education  2017 [employer].

## 2017-06-17 NOTE — Anesthesia Procedure Notes (Signed)
Procedure Name: Intubation Performed by: Lance Muss Pre-anesthesia Checklist: Patient identified, Patient being monitored, Timeout performed, Emergency Drugs available and Suction available Patient Re-evaluated:Patient Re-evaluated prior to induction Oxygen Delivery Method: Circle system utilized Preoxygenation: Pre-oxygenation with 100% oxygen Induction Type: IV induction, Cricoid Pressure applied and Rapid sequence Laryngoscope Size: Mac and 3 Grade View: Grade I Tube type: Oral Tube size: 7.5 mm Number of attempts: 1 Airway Equipment and Method: Stylet Placement Confirmation: ETT inserted through vocal cords under direct vision,  positive ETCO2 and breath sounds checked- equal and bilateral Secured at: 23 cm Tube secured with: Tape Dental Injury: Teeth and Oropharynx as per pre-operative assessment and Bloody posterior oropharynx

## 2017-06-17 NOTE — Anesthesia Post-op Follow-up Note (Signed)
Anesthesia QCDR form completed.        

## 2017-06-17 NOTE — Op Note (Signed)
laparascopic appendectomy   Carl Joseph Date of operation:  06/17/2017  Indications: The patient presented with a history of  abdominal pain. Workup has revealed findings consistent with acute appendicitis.  Pre-operative Diagnosis: Acute appendicitis  Post-operative Diagnosis: Acute appendicitis, suppurative, nonruptured  Surgeon: Adah Salvageichard E. Excell Seltzerooper, MD, FACS  Anesthesia: General with endotracheal tube  Procedure Details  The patient was seen again in the preop area. The options of surgery versus observation were reviewed with the patient and/or family. The risks of bleeding, infection, recurrence of symptoms, negative laparoscopy, potential for an open procedure, bowel injury, abscess or infection, were all reviewed as well. The patient was taken to Operating Room, identified as Carl Joseph and the procedure verified as laparoscopic appendectomy. A Time Out was held and the above information confirmed.  The patient was placed in the supine position and general anesthesia was induced.  Antibiotic prophylaxis was administered and VT E prophylaxis was in place. A Foley catheter was placed by the nursing staff.   The abdomen was prepped and draped in a sterile fashion. An infraumbilical incision was made. A Veress needle was placed and pneumoperitoneum was obtained. A 5 mm trocar port was placed without difficulty and the abdominal cavity was explored.  Under direct vision a 5 mm suprapubic port was placed and a 13 mm left lateral port was placed all under direct vision.  The appendix was identified and found to be acutely inflamed .  The appendix was carefully dissected. The base of the appendix was dissected out and divided with a standard load Endo GIA. The mesoappendix was divided with a vascular load Endo GIA.  There was brisk bleeding from the staple line and this was reinforced with clips which controlled the mild hemorrhage. The appendix was passed out through the left  lateral port site with the aid of an Endo Catch bag. The right lower quadrant and pelvis was then irrigated with copious amounts of normal saline which was aspirated. Inspection  failed to identify any additional bleeding and there were no signs of bowel injury. Therefore the left lateral port site was closed under direct vision utilizing an Endo Close technique with 0 Vicryl interrupted sutures, all under direct vision.   Again the right lower quadrant was inspected there was no sign of bleeding or bowel injury therefore pneumoperitoneum was released, all ports were removed and the skin incisions were approximated with subcuticular 4-0 Monocryl. Steri-Strips and Mastisol and sterile dressings were placed.  The patient tolerated the procedure well, there were no complications. The sponge lap and needle count were correct at the end of the procedure.  The patient was taken to the recovery room in stable condition to be admitted for continued care.  Findings: *Acute appendicitis separative nonruptured  Estimated Blood Loss: 50 cc                  Specimens: appendix         Complications: None                  Laquinta Hazell E. Excell Seltzerooper MD, FACS

## 2017-06-17 NOTE — ED Notes (Signed)
Patients belongings put into white hospital bag prior to transport:  [employer] wallet, phone, shirt, underwear, pants, and shoes.

## 2017-06-17 NOTE — Anesthesia Preprocedure Evaluation (Signed)
Anesthesia Evaluation  Patient identified by MRN, date of birth, ID band Patient awake    Reviewed: Allergy & Precautions, H&P , NPO status , Patient's Chart, lab work & pertinent test results, reviewed documented beta blocker date and time   History of Anesthesia Complications Negative for: history of anesthetic complications  Airway Mallampati: II  TM Distance: >3 FB Neck ROM: full    Dental  (+) Missing, Dental Advidsory Given, Teeth Intact Permanent retainer:   Pulmonary neg shortness of breath, neg COPD, neg recent URI, Current Smoker (Vapes on weekends),           Cardiovascular Exercise Tolerance: Good negative cardio ROS       Neuro/Psych negative neurological ROS  negative psych ROS   GI/Hepatic Neg liver ROS, GERD  ,  Endo/Other  negative endocrine ROS  Renal/GU negative Renal ROS  negative genitourinary   Musculoskeletal   Abdominal   Peds  Hematology negative hematology ROS (+)   Anesthesia Other Findings History reviewed. No pertinent past medical history.   Reproductive/Obstetrics negative OB ROS                             Anesthesia Physical Anesthesia Plan  ASA: II  Anesthesia Plan: General   Post-op Pain Management:    Induction: Intravenous, Rapid sequence and Cricoid pressure planned  PONV Risk Score and Plan: 2 and Ondansetron and Dexamethasone  Airway Management Planned: Oral ETT  Additional Equipment:   Intra-op Plan:   Post-operative Plan: Extubation in OR  Informed Consent: I have reviewed the patients History and Physical, chart, labs and discussed the procedure including the risks, benefits and alternatives for the proposed anesthesia with the patient or authorized representative who has indicated his/her understanding and acceptance.   Dental Advisory Given  Plan Discussed with: Anesthesiologist, CRNA and Surgeon  Anesthesia Plan Comments:          Anesthesia Quick Evaluation

## 2017-06-17 NOTE — Anesthesia Postprocedure Evaluation (Signed)
Anesthesia Post Note  Patient: Carl Joseph  Procedure(s) Performed: APPENDECTOMY LAPAROSCOPIC (N/A )  Patient location during evaluation: PACU Anesthesia Type: General Level of consciousness: awake and alert Pain management: pain level controlled Vital Signs Assessment: post-procedure vital signs reviewed and stable Respiratory status: spontaneous breathing, nonlabored ventilation, respiratory function stable and patient connected to nasal cannula oxygen Cardiovascular status: blood pressure returned to baseline and stable Postop Assessment: no apparent nausea or vomiting Anesthetic complications: no     Last Vitals:  Vitals:   06/17/17 0253 06/17/17 0401  BP: 115/61 123/83  Pulse: 70 68  Resp: 17 19  Temp:  36.8 C  SpO2: 97% 99%    Last Pain:  Vitals:   06/17/17 0401  TempSrc: Oral  PainSc:                  Lenard SimmerAndrew Cheria Sadiq

## 2017-06-18 ENCOUNTER — Telehealth: Payer: Self-pay

## 2017-06-18 LAB — SURGICAL PATHOLOGY

## 2017-06-18 NOTE — Telephone Encounter (Signed)
Call made to patient at this time. Left message for patient to call back with any questions or concerns that he may have. Left a reminded of follow up appointment on 11/14 at 2:15.

## 2017-07-03 ENCOUNTER — Telehealth: Payer: Self-pay | Admitting: Surgery

## 2017-07-03 ENCOUNTER — Encounter: Payer: Self-pay | Admitting: Surgery

## 2017-07-03 NOTE — Telephone Encounter (Signed)
Unable to leave voicemail for patient, inbox was full, if patient calls back. Please r/s appointment for tomorrow with Dr. Excell Seltzerooper.

## 2017-07-23 NOTE — Telephone Encounter (Signed)
Spoke with patient, asked if he could call back at a later time and r/s his missed appointment. Please r/s when patient calls back.

## 2017-08-06 ENCOUNTER — Encounter: Payer: Self-pay | Admitting: Surgery

## 2017-08-06 NOTE — Telephone Encounter (Signed)
Unable to leave message on patients phone, due to inbox is full, I have mailed a letter for the patient to call the office to r/s his missed appointment.

## 2017-12-13 LAB — HIV ANTIBODY (ROUTINE TESTING W REFLEX): HIV Screen 4th Generation wRfx: NONREACTIVE

## 2018-05-09 IMAGING — CT CT ABD-PELV W/ CM
2 of 4 series · 15 of 46 positions shown, 17 images · IV contrast (APPLIED)
Comparison: None.

CLINICAL DATA: Right-sided abdominal pain for 4 hours.

EXAM:
CT ABDOMEN AND PELVIS WITH CONTRAST
TECHNIQUE: Multidetector CT imaging of the abdomen and pelvis was performed
using the standard protocol following bolus administration of
intravenous contrast.
CONTRAST:  100mL [id number] IOPAMIDOL ([id number]) INJECTION 61%

[Series 2: routine abd/pel with · axial · 0.73mm/px · z∈[-180,+270]mm · 12 of 100 slices shown, 14 images]
[im 5/100  soft-tissue]
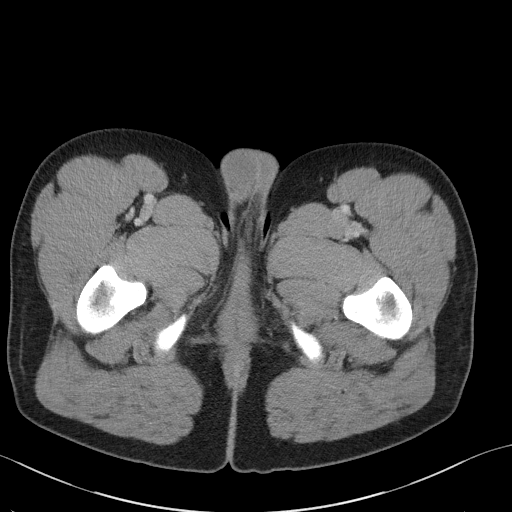
[im 5/100  bone]
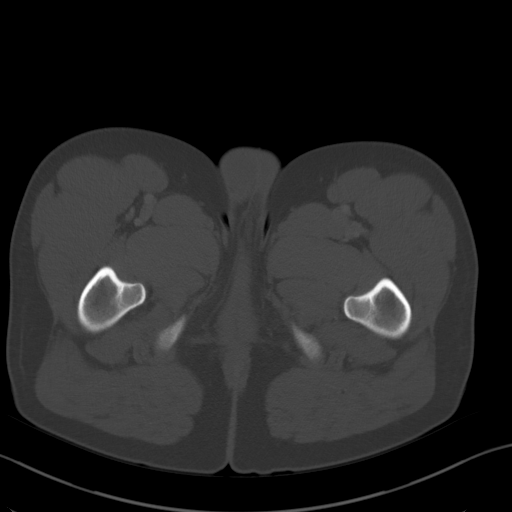
[im 13/100  soft-tissue]
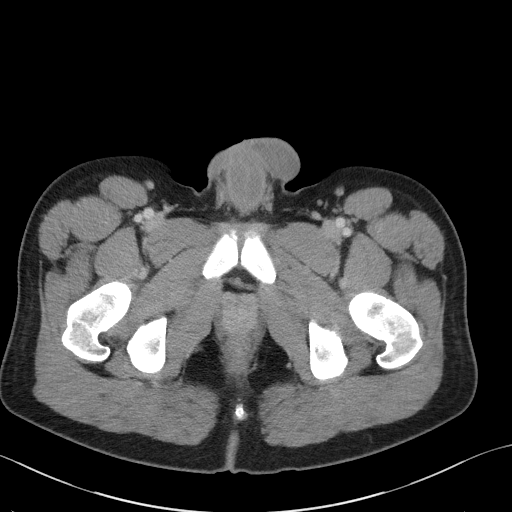
[im 21/100  soft-tissue]
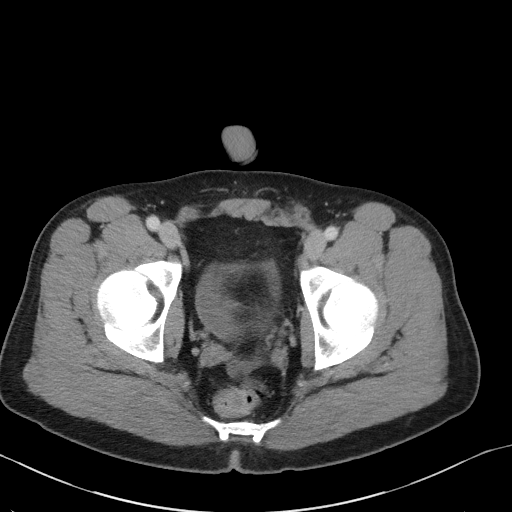
[im 29/100  soft-tissue]
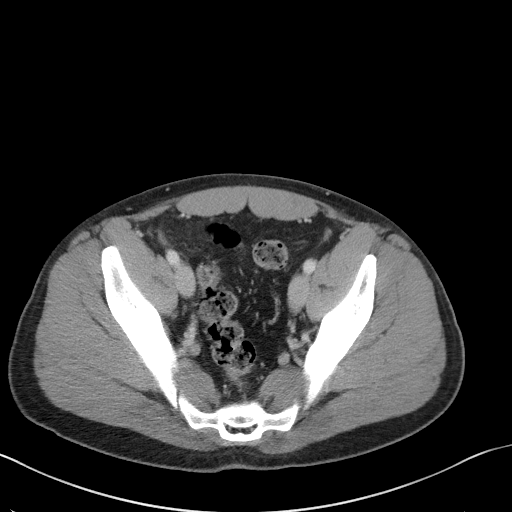
[im 38/100  soft-tissue]
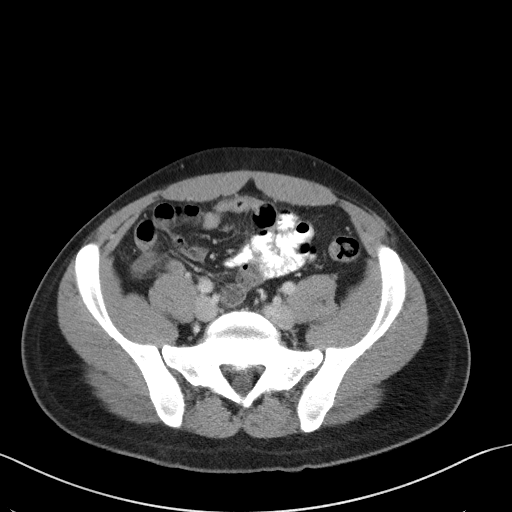
[im 46/100  soft-tissue]
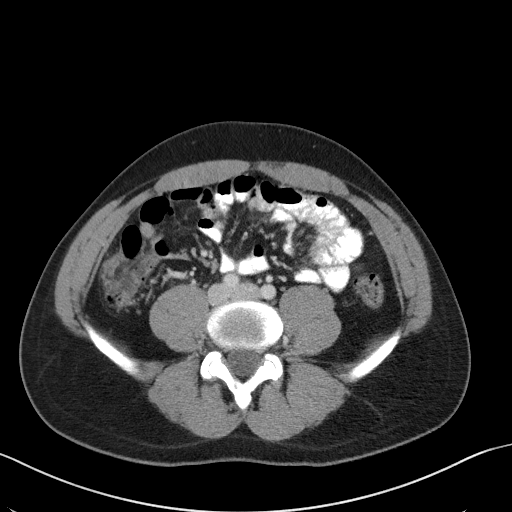
[im 54/100  soft-tissue]
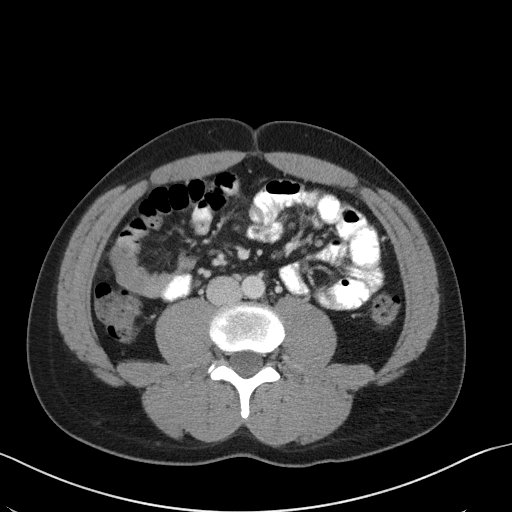
[im 62/100  soft-tissue]
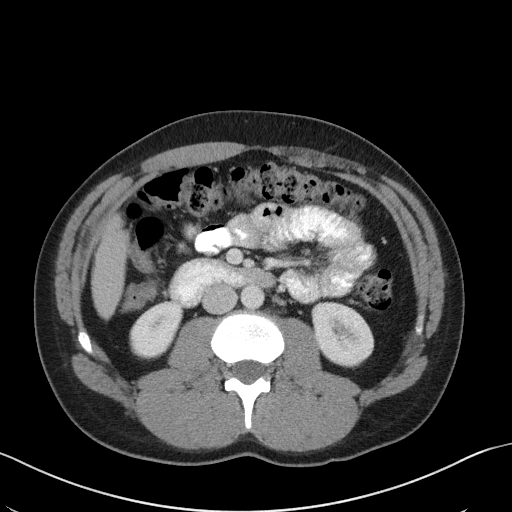
[im 71/100  soft-tissue]
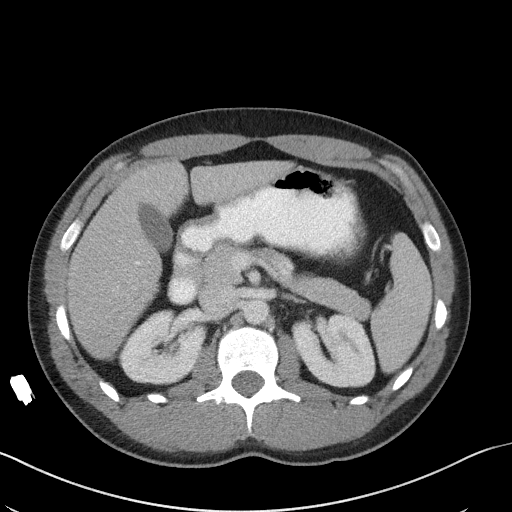
[im 71/100  bone]
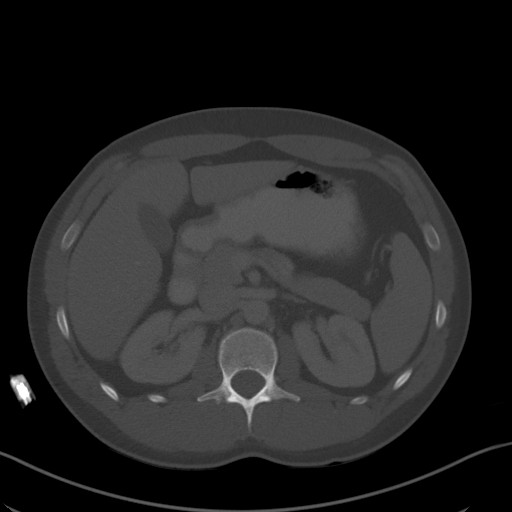
[im 79/100  soft-tissue]
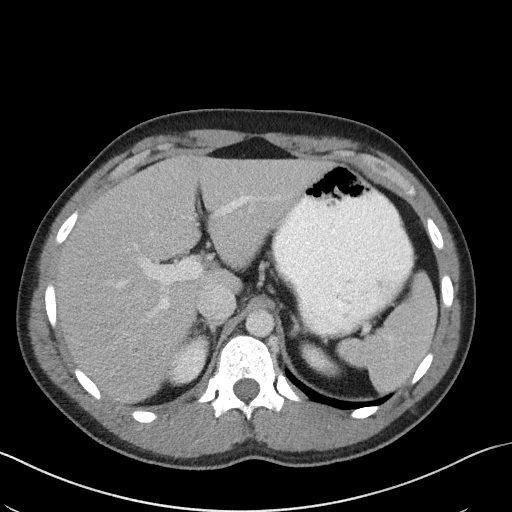
[im 87/100  soft-tissue]
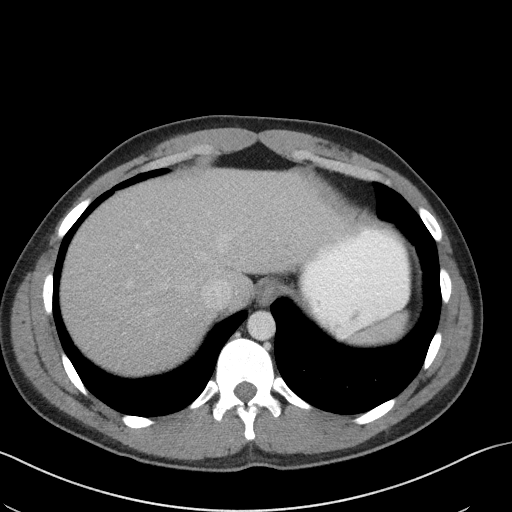
[im 95/100  soft-tissue]
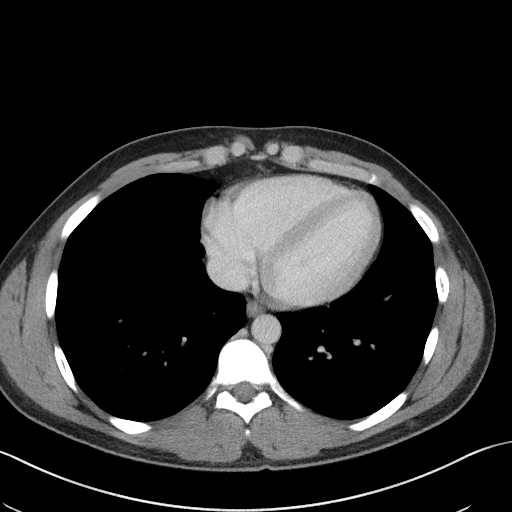

[Series 5: coronal st · coronal · 0.66mm/px · 3 of 89 slices shown]
[im 30/89  soft-tissue]
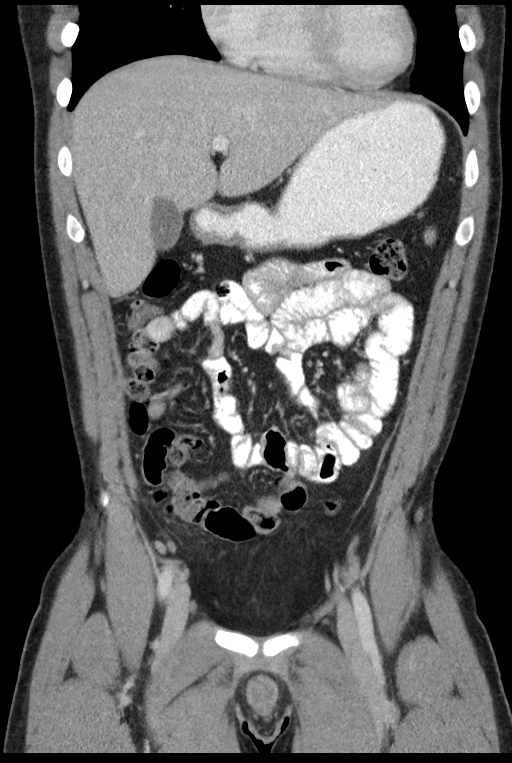
[im 40/89  soft-tissue]
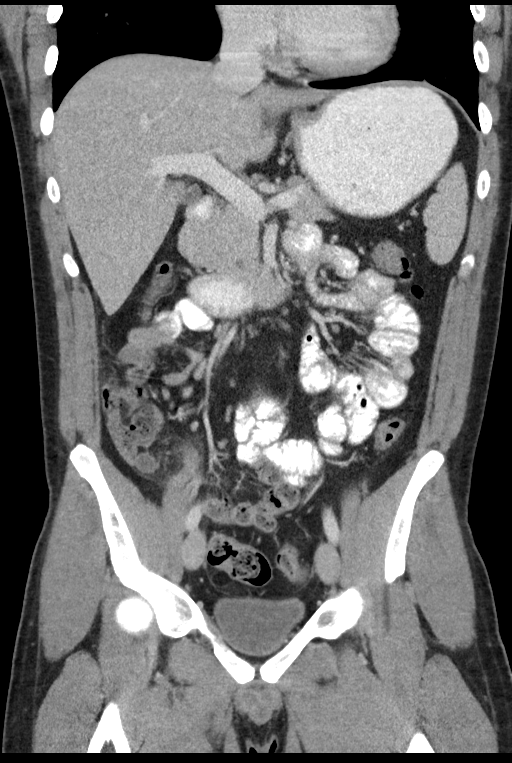
[im 49/89  soft-tissue]
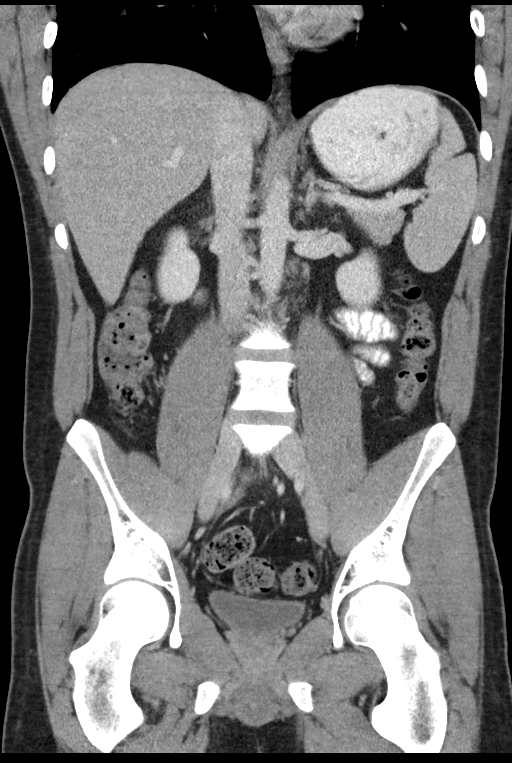

[15 of 46 positions shown; findings below may reference images not displayed]

FINDINGS: Lower chest: The lung bases are clear.

Hepatobiliary: No focal liver abnormality is seen. No gallstones,
gallbladder wall thickening, or biliary dilatation.

Pancreas: No ductal dilatation or inflammation.

Spleen: Normal in size without focal abnormality.

Adrenals/Urinary Tract: Adrenal glands are unremarkable. Kidneys are
normal, without renal calculi, focal lesion, or hydronephrosis.
Bladder is unremarkable.

Stomach/Bowel: The appendix is dilated measuring 11 mm, with thick
enhancing wall and surrounding periappendiceal inflammation. There
is a 9 mm appendicolith at the base. Trace periappendiceal free
fluid and free fluid in the pelvis, no abscess or free air. The
stomach, small and large bowel are otherwise normal.

Vascular/Lymphatic: Small ileal colic and retroperitoneal nodes,
presumably reactive. Vascular structures are unremarkable.

Reproductive: Prostate is unremarkable.

Other: Small amount of free fluid in the pelvis. No abscess or free
air. No upper abdominal ascites.

Musculoskeletal: There are no acute or suspicious osseous
abnormalities.
IMPRESSION: Acute appendicitis with an appendicolith at the base. No free air,
abscess or evidence of perforation.
